# Patient Record
Sex: Male | Born: 1985 | Hispanic: Yes | Marital: Single | Smoking: Never smoker
Health system: Southern US, Community
[De-identification: ages and names within clinical notes are randomized; demographics above are authoritative.]

## PROBLEM LIST (undated history)

## (undated) DIAGNOSIS — K469 Unspecified abdominal hernia without obstruction or gangrene: Secondary | ICD-10-CM

## (undated) DIAGNOSIS — K760 Fatty (change of) liver, not elsewhere classified: Secondary | ICD-10-CM

---

## 2016-12-21 ENCOUNTER — Other Ambulatory Visit: Payer: Self-pay

## 2016-12-21 ENCOUNTER — Emergency Department
Admission: EM | Admit: 2016-12-21 | Discharge: 2016-12-21 | Disposition: A | Discharge status: Home / Self Care | Payer: Self-pay | Attending: Student in an Organized Health Care Education/Training Program | Admitting: Student in an Organized Health Care Education/Training Program

## 2016-12-21 ENCOUNTER — Emergency Department: Payer: Self-pay

## 2016-12-21 ENCOUNTER — Encounter: Payer: Self-pay | Admitting: Emergency Medicine

## 2016-12-21 DIAGNOSIS — K824 Cholesterolosis of gallbladder: Secondary | ICD-10-CM | POA: Insufficient documentation

## 2016-12-21 DIAGNOSIS — R1011 Right upper quadrant pain: Secondary | ICD-10-CM

## 2016-12-21 HISTORY — DX: Fatty (change of) liver, not elsewhere classified: K76.0

## 2016-12-21 HISTORY — DX: Unspecified abdominal hernia without obstruction or gangrene: K46.9

## 2016-12-21 LAB — COMPREHENSIVE METABOLIC PANEL
ALBUMIN: 4.4 g/dL (ref 3.5–5.0)
ALK PHOS: 88 U/L (ref 38–126)
ALT: 30 U/L (ref 17–63)
AST: 25 U/L (ref 15–41)
Anion gap: 7 (ref 5–15)
BUN: 11 mg/dL (ref 6–20)
CALCIUM: 9.5 mg/dL (ref 8.9–10.3)
CO2: 27 mmol/L (ref 22–32)
CREATININE: 0.72 mg/dL (ref 0.61–1.24)
Chloride: 102 mmol/L (ref 101–111)
GFR calc non Af Amer: >60 mL/min (ref >60)
GLUCOSE: 106 mg/dL — AB (ref 65–99)
Potassium: 4.3 mmol/L (ref 3.5–5.1)
SODIUM: 136 mmol/L (ref 135–145)
Total Bilirubin: 0.6 mg/dL (ref 0.3–1.2)
Total Protein: 8 g/dL (ref 6.5–8.1)

## 2016-12-21 LAB — CBC
HCT: 47.1 % (ref 40.0–52.0)
Hemoglobin: 15.9 g/dL (ref 13.0–18.0)
MCH: 29.5 pg (ref 26.0–34.0)
MCHC: 33.6 g/dL (ref 32.0–36.0)
MCV: 87.8 fL (ref 80.0–100.0)
PLATELETS: 237 10*3/uL (ref 150–440)
RBC: 5.37 MIL/uL (ref 4.40–5.90)
RDW: 13.4 % (ref 11.5–14.5)
WBC: 13.5 10*3/uL — ABNORMAL HIGH (ref 3.8–10.6)

## 2016-12-21 LAB — LIPASE, BLOOD: Lipase: 26 U/L (ref 11–51)

## 2016-12-21 MED ORDER — MORPHINE SULFATE (PF) 4 MG/ML IV SOLN: 4.0000 mg | INTRAVENOUS | Status: DC | PRN

## 2016-12-21 MED ORDER — RANITIDINE HCL 150 MG PO TABS: 150.0000 mg | ORAL_TABLET | Freq: Two times a day (BID) | ORAL | 0 refills | Status: AC

## 2016-12-21 MED ORDER — DICYCLOMINE HCL 10 MG PO CAPS: 10.0000 mg | ORAL_CAPSULE | Freq: Three times a day (TID) | ORAL | 0 refills | Status: DC | PRN

## 2016-12-21 MED ORDER — DICYCLOMINE HCL 10 MG PO CAPS: 10.0000 mg | ORAL_CAPSULE | Freq: Once | ORAL | Status: DC

## 2016-12-21 NOTE — ED Notes (Signed)
Pt does not speak [place] - see assessment and notes from provider

## 2016-12-21 NOTE — ED Provider Notes (Signed)
[facility] Emergency Department Provider Note    First MD Initiated Contact with Patient 12/21/16 2020     (approximate)  I have reviewed the triage vital signs and the nursing notes.   HISTORY  Chief Complaint Abdominal Pain    HPI Vincent Kelly is a 31 y.o. male presents with history of chronic right upper quadrant pain that became acutely worse last week.  States he is got a history of fatty liver disease diagnosed with ultrasound and [place].  States he has a long history of drinking but has not had any alcohol recently.  Denies feeling nauseated and has had some episodes of vomiting particularly after eating.  This is particularly worse with eating fatty foods and spicy foods.  Denies any chest pain or shortness of breath.  No dysuria or hematuria.  Past Medical History:  Diagnosis Date  . Fatty liver disease, nonalcoholic   . Hernia of abdominal cavity    No family history on file. No past surgical history on file. There are no active problems to display for this patient.     Prior to Admission medications   Not on File    Allergies Naproxen    Social History Social History   Tobacco Use  . Smoking status: Never Smoker  . Smokeless tobacco: Never Used  Substance Use Topics  . Alcohol use: Yes  . Drug use: No    Review of Systems Patient denies headaches, rhinorrhea, blurry vision, numbness, shortness of breath, chest pain, edema, cough, abdominal pain, nausea, vomiting, diarrhea, dysuria, fevers, rashes or hallucinations unless otherwise stated above in HPI. ____________________________________________   PHYSICAL EXAM:  VITAL SIGNS: Vitals:   12/21/16 1847  BP: 127/83  Pulse: 82  Resp: 16  Temp: 98.3 F (36.8 C)  SpO2: 100%    Constitutional: Alert and oriented. Well appearing and in no acute distress. Eyes: Conjunctivae are normal.  Head: Atraumatic. Nose: No congestion/rhinnorhea. Mouth/Throat: Mucous  membranes are moist.   Neck: No stridor. Painless ROM.  Cardiovascular: Normal rate, regular rhythm. Grossly normal heart sounds.  Good peripheral circulation. Respiratory: Normal respiratory effort.  No retractions. Lungs CTAB. Gastrointestinal: Soft with mild ruq ttp. No distention. No abdominal bruits. No CVA tenderness. Genitourinary:  Musculoskeletal: No lower extremity tenderness nor edema.  No joint effusions. Neurologic:  Normal speech and language. No gross focal neurologic deficits are appreciated. No facial droop Skin:  Skin is warm, dry and intact. No rash noted. Psychiatric: Mood and affect are normal. Speech and behavior are normal.  ____________________________________________   LABS (all labs ordered are listed, but only abnormal results are displayed)  Results for orders placed or performed during the hospital encounter of 12/21/16 (from the past 24 hour(s))  Lipase, blood     Status: None   Collection Time: 12/21/16  6:53 PM  Result Value Ref Range   Lipase 26 11 - 51 U/L  Comprehensive metabolic panel     Status: Abnormal   Collection Time: 12/21/16  6:53 PM  Result Value Ref Range   Sodium 136 135 - 145 mmol/L   Potassium 4.3 3.5 - 5.1 mmol/L   Chloride 102 101 - 111 mmol/L   CO2 27 22 - 32 mmol/L   Glucose, Bld 106 (H) 65 - 99 mg/dL   BUN 11 6 - 20 mg/dL   Creatinine, Ser 1.610.72 0.61 - 1.24 mg/dL   Calcium 9.5 8.9 - 09.610.3 mg/dL   Total Protein 8.0 6.5 - 8.1 g/dL  Albumin 4.4 3.5 - 5.0 g/dL   AST 25 15 - 41 U/L   ALT 30 17 - 63 U/L   Alkaline Phosphatase 88 38 - 126 U/L   Total Bilirubin 0.6 0.3 - 1.2 mg/dL   GFR calc non Af Amer >60 >60 mL/min   GFR calc Af Amer >60 >60 mL/min   Anion gap 7 5 - 15  CBC     Status: Abnormal   Collection Time: 12/21/16  8:07 PM  Result Value Ref Range   WBC 13.5 (H) 3.8 - 10.6 K/uL   RBC 5.37 4.40 - 5.90 MIL/uL   Hemoglobin 15.9 13.0 - 18.0 g/dL   HCT 40.947.1 81.140.0 - 91.452.0 %   MCV 87.8 80.0 - 100.0 fL   MCH 29.5 26.0 -  34.0 pg   MCHC 33.6 32.0 - 36.0 g/dL   RDW 78.213.4 95.611.5 - 21.314.5 %   Platelets 237 150 - 440 K/uL   ____________________________________________ _________________________________  RADIOLOGY  I personally reviewed all radiographic images ordered to evaluate for the above acute complaints and reviewed radiology reports and findings.  These findings were personally discussed with the patient.  Please see medical record for radiology report.  ____________________________________________   PROCEDURES  Procedure(s) performed:  Procedures    Critical Care performed: no ____________________________________________   INITIAL IMPRESSION / ASSESSMENT AND PLAN / ED COURSE  Pertinent labs & imaging results that were available during my care of the patient were reviewed by me and considered in my medical decision making (see chart for details).  DDX: hepatitis, cholecystitis, cholelithiasis, pancreatitis, msk strain  Vincent Kelly is a [age] y.o. who presents to the ED with symptoms as described above.  Patient is well-appearing and in no acute distress.  He is afebrile and hemodynamically stable.  Blood work and ultrasound ordered for the above differential shows no evidence of acute cholelithiasis or Coley cystitis.  No evidence of pancreatitis.  Patient does have mild leukocytosis may be secondary to nausea.  No diarrhea no chest pain.  No signs or symptoms of pneumonia or acute appendicitis.  Denies any dysuria to suggest UTI and patient was unable to provide urinalysis.  Patient will be given referral for further workup as an outpatient given the duration of the symptoms and prescription for pain medication.  Have discussed with the patient and available family all diagnostics and treatments performed thus far and all questions were answered to the best of my ability. The patient demonstrates understanding and agreement with plan.        ____________________________________________   FINAL CLINICAL IMPRESSION(S) / ED DIAGNOSES  Final diagnoses:  RUQ pain  Cholesterolosis of gallbladder      NEW MEDICATIONS STARTED DURING THIS VISIT:  This SmartLink is deprecated. Use AVSMEDLIST instead to display the medication list for a patient.   Note:  This document was prepared using Dragon voice recognition software and may include unintentional dictation errors.    Willy Eddyobinson, Jaala Bohle, MD 12/21/16 [phone]

## 2016-12-21 NOTE — ED Triage Notes (Signed)
Pt in via POV with complaints of RUQ abdominal pain x approximately one week.  Pt reports hx of fatty liver disease, reports being off medications x 2 months.  Vitals WDL, NAD noted at this time.

## 2017-03-24 ENCOUNTER — Emergency Department
Admission: EM | Admit: 2017-03-24 | Discharge: 2017-03-24 | Disposition: A | Discharge status: Home / Self Care | Payer: Self-pay | Attending: Emergency Medicine | Admitting: Emergency Medicine

## 2017-03-24 ENCOUNTER — Emergency Department: Payer: Self-pay

## 2017-03-24 ENCOUNTER — Encounter: Payer: Self-pay | Admitting: Emergency Medicine

## 2017-03-24 ENCOUNTER — Other Ambulatory Visit: Payer: Self-pay

## 2017-03-24 DIAGNOSIS — R1013 Epigastric pain: Secondary | ICD-10-CM | POA: Insufficient documentation

## 2017-03-24 LAB — COMPREHENSIVE METABOLIC PANEL
ALK PHOS: 74 U/L (ref 38–126)
ALT: 39 U/L (ref 17–63)
AST: 34 U/L (ref 15–41)
Albumin: 4.5 g/dL (ref 3.5–5.0)
Anion gap: 9 (ref 5–15)
BUN: 12 mg/dL (ref 6–20)
CALCIUM: 9.2 mg/dL (ref 8.9–10.3)
CHLORIDE: 107 mmol/L (ref 101–111)
CO2: 23 mmol/L (ref 22–32)
CREATININE: 0.74 mg/dL (ref 0.61–1.24)
Glucose, Bld: 105 mg/dL — ABNORMAL HIGH (ref 65–99)
Potassium: 3.8 mmol/L (ref 3.5–5.1)
SODIUM: 139 mmol/L (ref 135–145)
Total Bilirubin: 0.5 mg/dL (ref 0.3–1.2)
Total Protein: 8.2 g/dL — ABNORMAL HIGH (ref 6.5–8.1)

## 2017-03-24 LAB — CBC
HCT: 45.4 % (ref 40.0–52.0)
Hemoglobin: 15.5 g/dL (ref 13.0–18.0)
MCH: 30.4 pg (ref 26.0–34.0)
MCHC: 34.1 g/dL (ref 32.0–36.0)
MCV: 89 fL (ref 80.0–100.0)
PLATELETS: 248 10*3/uL (ref 150–440)
RBC: 5.1 MIL/uL (ref 4.40–5.90)
RDW: 14 % (ref 11.5–14.5)
WBC: 10 10*3/uL (ref 3.8–10.6)

## 2017-03-24 LAB — URINALYSIS, COMPLETE (UACMP) WITH MICROSCOPIC
Bacteria, UA: NONE SEEN
Bilirubin Urine: NEGATIVE
GLUCOSE, UA: NEGATIVE mg/dL
Hgb urine dipstick: NEGATIVE
KETONES UR: NEGATIVE mg/dL
Leukocytes, UA: NEGATIVE
Nitrite: NEGATIVE
PH: 5 (ref 5.0–8.0)
Protein, ur: NEGATIVE mg/dL
SPECIFIC GRAVITY, URINE: 1.026 (ref 1.005–1.030)
SQUAMOUS EPITHELIAL / LPF: NONE SEEN

## 2017-03-24 LAB — TROPONIN I: Troponin I: <0.03 ng/mL (ref <0.03)

## 2017-03-24 LAB — LIPASE, BLOOD: LIPASE: 29 U/L (ref 11–51)

## 2017-03-24 MED ORDER — DICYCLOMINE HCL 10 MG PO CAPS: 10.0000 mg | ORAL_CAPSULE | Freq: Four times a day (QID) | ORAL | 0 refills | Status: AC

## 2017-03-24 MED ORDER — OXYCODONE-ACETAMINOPHEN 5-325 MG PO TABS
1.0000 | ORAL_TABLET | Freq: Once | ORAL | Status: AC
  Administered 2017-03-24: 1 via ORAL
  Filled 2017-03-24: qty 1

## 2017-03-24 MED ORDER — GI COCKTAIL ~~LOC~~
30.0000 mL | Freq: Once | ORAL | Status: AC
  Administered 2017-03-24: 30 mL via ORAL
  Filled 2017-03-24: qty 30

## 2017-03-24 MED ORDER — FAMOTIDINE 20 MG PO TABS: 20.0000 mg | ORAL_TABLET | Freq: Two times a day (BID) | ORAL | 1 refills | Status: AC

## 2017-03-24 NOTE — ED Notes (Signed)
esig pad not working

## 2017-03-24 NOTE — ED Notes (Addendum)
Pt states seen here before for same, dx with fatty gallbladder. tx with unknown medication, remained pain free for 1 month, but pain has returned. Denies N/V but reports diarrhea when pain is flaring. Pt did not follow up with GI.

## 2017-03-24 NOTE — ED Notes (Signed)

## 2017-03-24 NOTE — ED Triage Notes (Signed)
Pt arrived with friend with complaints of upper abdominal/lower right chest pain. Pt states he was seen here 3 months ago with a similar pain that was attributed to his gallbladder. Pt states he was sent home with medication but ran out a month ago. Pt is unsure of what the medication was.

## 2017-03-24 NOTE — ED Provider Notes (Signed)
[facility] Emergency Department Provider Note  ____________________________________________   I have reviewed the triage vital signs and the nursing notes. Where available I have reviewed prior notes and, if possible and indicated, outside hospital notes.    HISTORY  Chief Complaint Abdominal Pain    HPI Vincent Kelly is a 32 y.o. male who has a history of nonalcoholic fatty liver disease, who actually used to drink alcohol every day until 15 days ago, states that he has had flashes of right-sided and epigastric abdominal discomfort off and on for the last several months.  He was seen for this in November of last year.  Reassuring ultrasound that showed only fatty liver, and went home.  The pain has come and gone.  Nothing makes it better nothing makes it worse except for sometimes spicy food does.  Nonradiating.  He denies any fever or chills.  No vomiting, sometimes he does have loose stools.  He does try to avoid certain foods that make the pain worse.  He has been advised that he has had acid indigestion issues in the past, he was trying to follow-up as an outpatient with a GI physician but was unable to find anyone to see him.  He states he has not been drinking alcohol for the last 15 days because he thought it was making his abdominal pain worse.  He denies any melena or bright red blood per rectum hematemesis, he denies dysuria urinary frequency or heme hematuria, he denies any other complaints.  He has no chest pain no shortness of breath.  The pain is very focal to the epigastric right upper quadrant and it is only worse with certain foods.   Past Medical History:  Diagnosis Date  . Fatty liver disease, nonalcoholic   . Hernia of abdominal cavity     There are no active problems to display for this patient.   History reviewed. No pertinent surgical history.  Prior to Admission medications   Medication Sig Start Date End Date Taking? Authorizing  Provider  dicyclomine (BENTYL) 10 MG capsule Take 1 capsule (10 mg total) 3 (three) times daily as needed for up to 14 days by mouth for spasms. 12/21/16 01/04/17  Willy Eddy, MD  ranitidine (ZANTAC) 150 MG tablet Take 1 tablet (150 mg total) 2 (two) times daily by mouth. 12/21/16 12/21/17  Willy Eddy, MD    Allergies Naproxen  No family history on file.  Social History Social History   Tobacco Use  . Smoking status: Never Smoker  . Smokeless tobacco: Never Used  Substance Use Topics  . Alcohol use: Yes  . Drug use: No    Review of Systems Constitutional: No fever/chills Eyes: No visual changes. ENT: No sore throat. No stiff neck no neck pain Cardiovascular: Denies chest pain. Respiratory: Denies shortness of breath. Gastrointestinal:   no vomiting.  Positive slight diarrhea.  No constipation. Genitourinary: Negative for dysuria. Musculoskeletal: Negative lower extremity swelling Skin: Negative for rash. Neurological: Negative for severe headaches, focal weakness or numbness.   ____________________________________________   PHYSICAL EXAM:  VITAL SIGNS: ED Triage Vitals  Enc Vitals Group     BP 03/24/17 1724 106/67     Pulse Rate 03/24/17 1724 80     Resp 03/24/17 1724 20     Temp 03/24/17 1724 100 F (37.8 C)     Temp Source 03/24/17 1724 Oral     SpO2 03/24/17 1724 96 %     Weight 03/24/17 1722 242 lb (109.8  kg)     Height 03/24/17 1722 5' 10.47" (1.79 m)     Head Circumference --      Peak Flow --      Pain Score 03/24/17 1722 10     Pain Loc --      Pain Edu? --      Excl. in GC? --     Constitutional: Alert and oriented. Well appearing and in no acute distress. Eyes: Conjunctivae are normal Head: Atraumatic HEENT: No congestion/rhinnorhea. Mucous membranes are moist.  Oropharynx non-erythematous Neck:   Nontender with no meningismus, no masses, no stridor Cardiovascular: Normal rate, regular rhythm. Grossly normal heart sounds.  Good  peripheral circulation. Respiratory: Normal respiratory effort.  No retractions. Lungs CTAB. Abdominal: Soft and there is minimal epigastric and right upper quadrant abdominal discomfort. No distention. No guarding no rebound Back:  There is no focal tenderness or step off.  there is no midline tenderness there are no lesions noted. there is no CVA tenderness Musculoskeletal: No lower extremity tenderness, no upper extremity tenderness. No joint effusions, no DVT signs strong distal pulses no edema Neurologic:  Normal speech and language. No gross focal neurologic deficits are appreciated.  Skin:  Skin is warm, dry and intact. No rash noted. Psychiatric: Mood and affect are normal. Speech and behavior are normal.  ____________________________________________   LABS (all labs ordered are listed, but only abnormal results are displayed)  Labs Reviewed  COMPREHENSIVE METABOLIC PANEL - Abnormal; Notable for the following components:      Result Value   Glucose, Bld 105 (*)    Total Protein 8.2 (*)    All other components within normal limits  URINALYSIS, COMPLETE (UACMP) WITH MICROSCOPIC - Abnormal; Notable for the following components:   Color, Urine YELLOW (*)    APPearance CLEAR (*)    All other components within normal limits  LIPASE, BLOOD  CBC    Pertinent labs  results that were available during my care of the patient were reviewed by me and considered in my medical decision making (see chart for details). ____________________________________________  EKG  I personally interpreted any EKGs ordered by me or triage EKG ordered in triage is normal sinus rhythm rate 84 bpm, flipped T waves noted inferiorly, normal axis, no acute ST elevation, nonspecific ST changes ____________________________________________  RADIOLOGY  Pertinent labs & imaging results that were available during my care of the patient were reviewed by me and considered in my medical decision making (see chart  for details). If possible, patient and/or family made aware of any abnormal findings.  Ct Renal Stone Study  Result Date: 03/24/2017 CLINICAL DATA:  Flank pain, upper abdominal and lower RIGHT chest pain EXAM: CT ABDOMEN AND PELVIS WITHOUT CONTRAST TECHNIQUE: Multidetector CT imaging of the abdomen and pelvis was performed following the standard protocol without IV contrast. Sagittal and coronal MPR images reconstructed from axial data set. COMPARISON:  None FINDINGS: Lower chest: Lung bases clear Hepatobiliary: Gallbladder and liver normal appearance Pancreas: Normal appearance Spleen: Normal appearance Adrenals/Urinary Tract: Adrenal glands normal appearance. Kidneys, ureters, and bladder normal appearance. No urinary tract calcification or dilatation. Stomach/Bowel: Normal appendix, retrocecal. Stomach and bowel loops normal appearance. Vascular/Lymphatic: Unremarkable Reproductive: Normal appearing prostate gland and seminal vesicles Other: No free air or free fluid. Tiny umbilical hernia containing fat. No acute inflammatory process. Musculoskeletal: Normal appearance IMPRESSION: Tiny umbilical hernia containing fat. Otherwise normal exam. Electronically Signed   By: Ulyses SouthwardMark  Boles M.D.   On: 03/24/2017 20:01   ____________________________________________  PROCEDURES  Procedure(s) performed: None  Procedures  Critical Care performed: None  ____________________________________________   INITIAL IMPRESSION / ASSESSMENT AND PLAN / ED COURSE  Pertinent labs & imaging results that were available during my care of the patient were reviewed by me and considered in my medical decision making (see chart for details).  Here with very reproducible right upper quadrant and epigastric abdominal discomfort.  Low suspicion for PE ACS or dissection.  This is very reproducible food related pain.  He is morbidly obese.  I did do a CT scan as a precaution, CT scan does not show any kidney disease, or kidney  stones, he does have pain towards the right side.  Though he does not have flank pain per se.  Liver function tests are reassuring blood work is reassuring vital signs are reassuring patient has been having pain for 3 months exactly like this on a daily basis low suspicion for ACS. Patient speaks Spanish, he is very comfortable with my Spanish he does not want me to call for an interpreter at this time, he states that he changes his mind she will let me know.  he feels that there are no barriers to communication between [place] at this time Given somewhat abnormal EKG even though he is only 35 and has no evidence of ACS by history we will send cardiac marker.  If that is negative I think we can send him home safely.  He is been having this pain every day for months, all day today.  We will give him pain medication here I will advise close outpatient follow-up with gastroenterology, will also give him antiacids.  Very benign exam and all of her findings are reassuring thus far   ____________________________________________   FINAL CLINICAL IMPRESSION(S) / ED DIAGNOSES  Final diagnoses:  None      This chart was dictated using voice recognition software.  Despite best efforts to proofread,  errors can occur which can change meaning.      Jeanmarie Plant, MD 03/24/17 2028

## 2017-12-19 ENCOUNTER — Emergency Department: Payer: Self-pay

## 2017-12-19 ENCOUNTER — Emergency Department
Admission: EM | Admit: 2017-12-19 | Discharge: 2017-12-19 | Disposition: A | Discharge status: Home / Self Care | Payer: Self-pay | Attending: Emergency Medicine | Admitting: Emergency Medicine

## 2017-12-19 ENCOUNTER — Other Ambulatory Visit: Payer: Self-pay

## 2017-12-19 DIAGNOSIS — R1011 Right upper quadrant pain: Secondary | ICD-10-CM | POA: Insufficient documentation

## 2017-12-19 DIAGNOSIS — K760 Fatty (change of) liver, not elsewhere classified: Secondary | ICD-10-CM | POA: Insufficient documentation

## 2017-12-19 LAB — COMPREHENSIVE METABOLIC PANEL
ALT: 32 U/L (ref 0–44)
ANION GAP: 7 (ref 5–15)
AST: 24 U/L (ref 15–41)
Albumin: 4.3 g/dL (ref 3.5–5.0)
Alkaline Phosphatase: 86 U/L (ref 38–126)
BILIRUBIN TOTAL: 0.9 mg/dL (ref 0.3–1.2)
BUN: 13 mg/dL (ref 6–20)
CO2: 27 mmol/L (ref 22–32)
Calcium: 9.4 mg/dL (ref 8.9–10.3)
Chloride: 105 mmol/L (ref 98–111)
Creatinine, Ser: 0.83 mg/dL (ref 0.61–1.24)
GFR calc Af Amer: >60 mL/min (ref >60)
Glucose, Bld: 146 mg/dL — ABNORMAL HIGH (ref 70–99)
POTASSIUM: 3.9 mmol/L (ref 3.5–5.1)
Sodium: 139 mmol/L (ref 135–145)
TOTAL PROTEIN: 7.7 g/dL (ref 6.5–8.1)

## 2017-12-19 LAB — URINALYSIS, COMPLETE (UACMP) WITH MICROSCOPIC
BACTERIA UA: NONE SEEN
Bilirubin Urine: NEGATIVE
GLUCOSE, UA: NEGATIVE mg/dL
Hgb urine dipstick: NEGATIVE
KETONES UR: NEGATIVE mg/dL
Leukocytes, UA: NEGATIVE
Nitrite: NEGATIVE
PROTEIN: NEGATIVE mg/dL
SQUAMOUS EPITHELIAL / LPF: NONE SEEN (ref 0–5)
Specific Gravity, Urine: 1.025 (ref 1.005–1.030)
pH: 5 (ref 5.0–8.0)

## 2017-12-19 LAB — CBC
HCT: 46.6 % (ref 39.0–52.0)
HEMOGLOBIN: 15.6 g/dL (ref 13.0–17.0)
MCH: 29.8 pg (ref 26.0–34.0)
MCHC: 33.5 g/dL (ref 30.0–36.0)
MCV: 88.9 fL (ref 80.0–100.0)
Platelets: 273 10*3/uL (ref 150–400)
RBC: 5.24 MIL/uL (ref 4.22–5.81)
RDW: 13.1 % (ref 11.5–15.5)
WBC: 9.2 10*3/uL (ref 4.0–10.5)
nRBC: 0 % (ref 0.0–0.2)

## 2017-12-19 LAB — LIPASE, BLOOD: Lipase: 33 U/L (ref 11–51)

## 2017-12-19 MED ORDER — FAMOTIDINE 20 MG PO TABS: 20.0000 mg | ORAL_TABLET | Freq: Two times a day (BID) | ORAL | 1 refills | Status: AC

## 2017-12-19 MED ORDER — MORPHINE SULFATE (PF) 4 MG/ML IV SOLN
4.0000 mg | Freq: Once | INTRAVENOUS | Status: AC
  Administered 2017-12-19: 4 mg via INTRAVENOUS
  Filled 2017-12-19: qty 1

## 2017-12-19 MED ORDER — IOHEXOL 300 MG/ML  SOLN
100.0000 mL | Freq: Once | INTRAMUSCULAR | Status: AC | PRN
  Administered 2017-12-19: 100 mL via INTRAVENOUS
  Filled 2017-12-19: qty 100

## 2017-12-19 MED ORDER — OXYCODONE HCL 5 MG PO TABS: 5.0000 mg | ORAL_TABLET | Freq: Three times a day (TID) | ORAL | 0 refills | Status: AC | PRN

## 2017-12-19 NOTE — ED Provider Notes (Signed)
[facility] Emergency Department Provider Note       Time seen: ----------------------------------------- 5:21 PM on 12/19/2017 -----------------------------------------   I have reviewed the triage vital signs and the nursing notes.  HISTORY   Chief Complaint Abdominal Pain    HPI Vincent Kelly is a 32 y.o. male with a history of fatty liver disease who presents to the ED for sharp and stabbing right upper quadrant pain that started 4 to 5 days ago after period of heavy drinking.  He denies any vomiting.  He has been seen for this in the past but had a negative ultrasound.  He denies fevers, chills or other complaints.  Past Medical History:  Diagnosis Date  . Fatty liver disease, nonalcoholic   . Hernia of abdominal cavity     There are no active problems to display for this patient.   History reviewed. No pertinent surgical history.  Allergies Naproxen  Social History Social History   Tobacco Use  . Smoking status: Never Smoker  . Smokeless tobacco: Never Used  Substance Use Topics  . Alcohol use: Yes  . Drug use: No   Review of Systems Constitutional: Negative for fever. Cardiovascular: Negative for chest pain. Respiratory: Negative for shortness of breath. Gastrointestinal: Positive for abdominal pain Musculoskeletal: Negative for back pain. Skin: Negative for rash. Neurological: Negative for headaches, focal weakness or numbness.  All systems negative/normal/unremarkable except as stated in the HPI  ____________________________________________   PHYSICAL EXAM:  VITAL SIGNS: ED Triage Vitals [12/19/17 1459]  Enc Vitals Group     BP (!) 142/84     Pulse Rate 97     Resp 18     Temp 98 F (36.7 C)     Temp Source Oral     SpO2 98 %     Weight 245 lb (111.1 kg)     Height 5' 10.47" (1.79 m)     Head Circumference      Peak Flow      Pain Score 10     Pain Loc      Pain Edu?      Excl. in GC?     Constitutional: Alert and oriented.  Mild distress Eyes: Conjunctivae are normal. Normal extraocular movements. Cardiovascular: Normal rate, regular rhythm. No murmurs, rubs, or gallops. Respiratory: Normal respiratory effort without tachypnea nor retractions. Breath sounds are clear and equal bilaterally. No wheezes/rales/rhonchi. Gastrointestinal: Right upper quadrant tenderness, no rebound or guarding.  Normal bowel sounds. Musculoskeletal: Nontender with normal range of motion in extremities. No lower extremity tenderness nor edema. Neurologic:  Normal speech and language. No gross focal neurologic deficits are appreciated.  Skin:  Skin is warm, dry and intact. No rash noted. Psychiatric: Mood and affect are normal. Speech and behavior are normal.  ____________________________________________  ED COURSE:  As part of my medical decision making, I reviewed the following data within the electronic MEDICAL RECORD NUMBER History obtained from family if available, nursing notes, old chart and ekg, as well as notes from prior ED visits. Patient presented for abdominal pain, we will assess with labs and imaging as indicated at this time.   Procedures ____________________________________________   LABS (pertinent positives/negatives)  Labs Reviewed  COMPREHENSIVE METABOLIC PANEL - Abnormal; Notable for the following components:      Result Value   Glucose, Bld 146 (*)    All other components within normal limits  URINALYSIS, COMPLETE (UACMP) WITH MICROSCOPIC - Abnormal; Notable for the following components:   Color, Urine  YELLOW (*)    APPearance CLEAR (*)    All other components within normal limits  LIPASE, BLOOD  CBC    RADIOLOGY  CT the abdomen pelvis with contrast IMPRESSION: 1. No acute abnormality in the abdomen or pelvis. 2. Small calcification near the origin of the appendix is suggestive for an appendicolith but no evidence for appendix inflammation or distension. 3.  Tiny umbilical hernia without complicating features. ____________________________________________  DIFFERENTIAL DIAGNOSIS   Gastritis, hepatitis, pancreatitis, dehydration, biliary colic, cholecystitis  FINAL ASSESSMENT AND PLAN  Abdominal pain   Plan: The patient had presented for right upper quadrant pain. Patient's labs were unremarkable. Patient's imaging also unremarkable.  No clear etiology for his abdominal pain is been discovered.  I will prescribe an antacid for him as well as pain medicine and refer him to gastroenterology for follow-up.   Ulice Dash, MD   Note: This note was generated in part or whole with voice recognition software. Voice recognition is usually quite accurate but there are transcription errors that can and very often do occur. I apologize for any typographical errors that were not detected and corrected.     Emily Filbert, MD 12/19/17 [phone]

## 2017-12-19 NOTE — ED Notes (Addendum)
Pt needs interpreter per his request Interpreter request placed

## 2017-12-19 NOTE — ED Triage Notes (Signed)
RUQ pain X 5 days ago. Pt unable to sit due to pain. Nausea and emesis. Denies CP. Endorses SOB.   Interpreter used for triage.

## 2017-12-19 NOTE — ED Notes (Signed)
Pt c/o sharp stabbing pain in right upper quad that started 4-5 days ago after a period of heavy drinking - denies vomiting - reports nausea

## 2017-12-19 NOTE — ED Notes (Signed)
Patient transported to CT 

## 2018-01-10 ENCOUNTER — Ambulatory Visit: Payer: Self-pay | Admitting: Gastroenterology

## 2018-03-01 ENCOUNTER — Ambulatory Visit: Payer: Self-pay | Admitting: Gastroenterology

## 2018-03-01 ENCOUNTER — Encounter: Payer: Self-pay | Admitting: Gastroenterology

## 2018-03-01 VITALS — BP 110/73 | HR 77 | Ht 70.0 in | Wt 272.2 lb

## 2018-03-01 DIAGNOSIS — K581 Irritable bowel syndrome with constipation: Secondary | ICD-10-CM

## 2018-03-01 DIAGNOSIS — R1011 Right upper quadrant pain: Secondary | ICD-10-CM

## 2018-03-01 DIAGNOSIS — R1013 Epigastric pain: Secondary | ICD-10-CM

## 2018-03-01 MED ORDER — OMEPRAZOLE 40 MG PO CPDR: 40.0000 mg | DELAYED_RELEASE_CAPSULE | Freq: Every day | ORAL | 3 refills | Status: AC

## 2018-03-01 NOTE — Progress Notes (Signed)
Wyline Mood MD, MRCP(U.K) [address]  [address]  [place], [place] [address]  Main: [phone]  Fax: [phone]   Gastroenterology Consultation  Referring Provider:     ER Primary Care Physician:  Patient, No Pcp Per Primary Gastroenterologist:  Dr. Wyline Mood  Reason for Consultation:     Abdominal pain         HPI:   Vincent Kelly is a 33 y.o. y/o male referred for abdominal pain . Does not speak any English , here with live interpretor who conducted the interview    He has been referred from the ER. He presented to the ER on 12/19/17 with RUQ pain of 5 days duration after drinking a lot of alcohol.He underwent a CT scan of the abdomen  Was normal except for a tiny umbilical hernia.   He also presented to the ER on 08/05/2017 with RUQ pain . Treated conservatively and discharged. RUQ USG in 2018 showed no gall stones.   LFT's and Lipase checked on 3 occasions have been normal.    He says that he has had abdominal pain since 4 years and he was in [place] who did an EGD , took several medicines which helped at that time. He was told he had a Hernia on his EGD.   The pain occurs at 5-6 am almost every with severe pain , the pain lasts till he takes pepcid. He describes the pain as a squeeze ,epigatric , during the day goes to the RUQ and can even affect his breathing due to intensity.  He states Lansoprazole given in [place] helped. Presently run out of them . He has a bowel movement once a day , used to suffer with constipation. Presently states his stool is normal .   Pain is better after a bowel movement at times but not always.   Drinks alcohol occasionally once a month .   Past Medical History:  Diagnosis Date  . Fatty liver disease, nonalcoholic   . Hernia of abdominal cavity     No past surgical history on file.  Prior to Admission medications   Medication Sig Start Date End Date Taking? Authorizing Provider  dicyclomine (BENTYL) 10 MG capsule Take 1  capsule (10 mg total) by mouth 4 (four) times daily for 14 days. 03/24/17 04/07/17  Jeanmarie Plant, MD  famotidine (PEPCID) 20 MG tablet Take 1 tablet (20 mg total) by mouth 2 (two) times daily. 03/24/17 03/24/18  Jeanmarie Plant, MD  famotidine (PEPCID) 20 MG tablet Take 1 tablet (20 mg total) by mouth 2 (two) times daily. 12/19/17   Emily Filbert, MD  oxyCODONE (ROXICODONE) 5 MG immediate release tablet Take 1 tablet (5 mg total) by mouth every 8 (eight) hours as needed. 12/19/17 12/19/18  Emily Filbert, MD  ranitidine (ZANTAC) 150 MG tablet Take 1 tablet (150 mg total) 2 (two) times daily by mouth. 12/21/16 12/21/17  Willy Eddy, MD    No family history on file.   Social History   Tobacco Use  . Smoking status: Never Smoker  . Smokeless tobacco: Never Used  Substance Use Topics  . Alcohol use: Yes  . Drug use: No    Allergies as of 03/01/2018 - Review Complete 12/19/2017  Allergen Reaction Noted  . Naproxen Other (See Comments) 12/21/2016    Review of Systems:    All systems reviewed and negative except where noted in HPI.   Physical Exam:  There were no vitals taken for  this visit. No LMP for male patient. Psych:  Alert and cooperative. Normal mood and affect. General:   Alert,  Well-developed, well-nourished, pleasant and cooperative in NAD Head:  Normocephalic and atraumatic. Eyes:  Sclera clear, no icterus.   Conjunctiva pink. Ears:  Normal auditory acuity. Nose:  No deformity, discharge, or lesions. Mouth:  No deformity or lesions,oropharynx pink & moist. Neck:  Supple; no masses or thyromegaly. Lungs:  Respirations even and unlabored.  Clear throughout to auscultation.   No wheezes, crackles, or rhonchi. No acute distress. Heart:  Regular rate and rhythm; no murmurs, clicks, rubs, or gallops. Abdomen:  Normal bowel sounds.  No bruits.  Soft, non-tender and non-distended without masses, hepatosplenomegaly or hernias noted.  No guarding or rebound tenderness.     Msk:  Symmetrical without gross deformities. Good, equal movement & strength bilaterally. Pulses:  Normal pulses noted. Extremities:  No clubbing or edema.  No cyanosis. Neurologic:  Alert and oriented x3;  grossly normal neurologically. Skin:  Intact without significant lesions or rashes. No jaundice. Lymph Nodes:  No significant cervical adenopathy. Psych:  Alert and cooperative. Normal mood and affect.  Imaging Studies: No results found.  Assessment and Plan:   Vincent Kelly is a 33 y.o. y/o male has been referred  to see me after being from after his ER visit in 12/2017 for abdominal pain. RUQ , 3 episodes so far. Based on his history Likely a combination of one or more of GERD+/- gastritis+/- IBS-C and will rule out gall bladder dyskinesia.    Plan  1. HIDA scan  2. H pylori breath test  3. Trial of PPI 4. High fiber diet patient information  5. GERD patient information 6. Fiber tablets - samples provided    Entire visit conducted via interpretor, old notes reviewed summarized .   Follow up in 4 weeks  Dr Wyline MoodKiran Shyteria Lewis MD,MRCP(U.K)

## 2018-03-01 NOTE — Patient Instructions (Signed)
Dieta rica en fibra  High-Fiber Diet  La fibra, tambin llamada fibra dietaria, es un tipo de carbohidrato que se encuentra en las frutas, las verduras, los cereales integrales y los frijoles. Una dieta rica en fibra puede tener muchos beneficios para la salud. El mdico puede recomendar una dieta rica en fibra para ayudar a:   Evitar el estreimiento. La fibra puede hacer que defeque con ms frecuencia.   Disminuir el nivel de colesterol.   Aliviar las siguientes afecciones:  ? Hinchazn de la venas en el ano (hemorroides).  ? Hinchazn e irritacin (inflamacin) de zonas especficas del tracto digestivo (diverticulitis sin complicaciones).  ? Un problema del intestino grueso (colon) que, a veces, causa dolor y diarrea (sndrome de colon irritable, SCI).   Evitar comer en exceso como parte de un plan para bajar de peso.   Evitar la enfermedad cardaca, la diabetes tipo 2 y ciertos cnceres.  En qu consiste el plan?  El consumo diario recomendado de fibra en gramos (g) incluye lo siguiente:   38g para hombres de 50 aos o menos.   30g para los hombres mayores de 50 aos.   25g para mujeres de 50 aos o menos.   21g para las mujeres mayores de 50 aos.  Puede recibir la ingesta diaria recomendada de fibra dietaria de la siguiente manera:   Coma una variedad de frutas, verduras, granos y frijoles.   Tome un suplemento de fibra, si no es posible comer suficiente fibra en su dieta.  Qu debo saber acerca de la dieta rica en fibra?   Es mejor obtener fibra directamente de los alimentos en lugar de recibirla de suplementos de fibra. No hay demasiada investigacin acerca de qu tan efectivos son los suplementos.   Verifique siempre el contenido de fibra en la etiqueta de informacin nutricional de los alimentos preenvasados. Busque alimentos que contengan 5g o ms de fibra por porcin.   Hable con un especialista en alimentacin y nutricin (nutricionista) si tiene preguntas sobre alimentos  especficos que se recomiendan o no para su afeccin mdica, especialmente si aquellos alimentos no estn detallados a continuacin.   Aumente gradualmente la cantidad de fibra que consume. Si aumenta demasiado rpido el consumo de fibra dietaria puede provocar distensin abdominal, clicos o gases.   Beba abundante agua. El agua ayuda a digerir la fibra.  Cules son algunos consejos para seguir este plan?   Consuma una gran variedad de alimentos ricos en fibra.   Asegrese de que al menos la mitad de los granos que ingiere cada da sean cereales integrales.   Coma panes y cereales hechos con harina de cereales integrales en lugar de harina refinada o blanca.   Coma arroz integral, trigo burgol o mijo en lugar de arroz blanco.   Comience el da con un desayuno rico en fibra, como un cereal que contenga al menos 5g de fibra o ms por porcin.   Use frijoles en lugar de carne en las sopas, ensaladas o platos de pastas.   Coma colaciones ricas en fibra, como frutos rojos, verduras crudas, frutos secos y palomitas de maz.   Elija frutas y verduras enteras en lugar de formas procesadas como jugo o salsa.  Qu alimentos puedo comer?    Frutas  Frutos rojos. Peras. Manzanas. Naranjas. [place]ac[place]. Ciruelas y pasas. Higos secos.  Verduras  Batatas. Espinaca. Col rizada. Alcachofas. Repollo. Brcoli. Coliflor. Guisantes. Zanahorias. [place]aza.  Cereales  Panes integrales. Cereal multigrano. Avena. Arroz integral. Cebada. Trigo burgol. Mijo. Quinua. Magdalenas   de salvado. Palomitas de maz. Galletas de centeno.  Carnes y otras protenas  Frijoles blancos, colorados y pintos. Soja. Guisantes secos. Lentejas. Frutos secos y semillas.  Lcteos  Yogur fortificado con fibra.  Bebidas  Leche de soja fortificada con fibra. Jugo de naranja fortificado con fibra.  Otros alimentos  Barras de fibra.  Es posible que los productos mencionados arriba no formen una lista completa de las bebidas y los alimentos recomendados.  Comunquese con un nutricionista para conocer ms opciones.  Qu alimentos no se recomiendan?  Frutas  Jugo de frutas. Frutas cocidas coladas.  Verduras  Papas fritas. Verduras enlatadas. Verduras muy cocidas.  Cereales  Pan blanco. Pastas hechas con harina refinada. Arroz blanco.  Carnes y otras protenas  Cortes de carne con grasa. Pollo o pescado fritos.  Lcteos  Leche. Yogur. Queso crema. Crema cida.  Grasas y aceites  Mantequillas.  Bebidas  Gaseosas.  Otros alimentos  Tortas y pasteles.  Es posible que los productos que se enumeran ms arriba no sean una lista completa de los alimentos y las bebidas que se deben evitar. Comunquese con un nutricionista para obtener ms informacin.  Resumen   La fibra es un tipo de carbohidrato. Se encuentra en las frutas, las verduras, los cereales integrales y los frijoles.   Una dieta rica en fibra representa muchos beneficios para la salud, como evitar el estreimiento, bajar el colesterol en la sangre, ayudar a perder peso y reducir el riesgo de enfermedad cardaca, diabetes y ciertos tipos de cncer.   Aumente gradualmente la ingesta de fibra. El aumento demasiado rpido puede provocar clicos, distensin abdominal y gases. Beba mucha agua a la vez que aumenta la fibra.   Las mejores fuentes de fibra incluyen frutas y verduras enteras, granos integrales, frutos secos, semillas y frijoles.  Esta informacin no tiene como fin reemplazar el consejo del mdico. Asegrese de hacerle al mdico cualquier pregunta que tenga.  Document Released: 02/01/2005 Document Revised: 01/21/2017 Document Reviewed: 01/21/2017  Elsevier Interactive Patient Education  2019 [employer]

## 2018-03-02 LAB — H. PYLORI BREATH TEST: H pylori Breath Test: POSITIVE — AB

## 2018-03-06 ENCOUNTER — Encounter: Payer: Self-pay | Admitting: Gastroenterology

## 2018-03-07 ENCOUNTER — Ambulatory Visit: Payer: Self-pay

## 2018-03-10 ENCOUNTER — Other Ambulatory Visit: Payer: Self-pay

## 2018-03-10 ENCOUNTER — Telehealth: Payer: Self-pay

## 2018-03-10 MED ORDER — AMOXICILLIN 500 MG PO CAPS: 1000.0000 mg | ORAL_CAPSULE | Freq: Three times a day (TID) | ORAL | 0 refills | Status: AC

## 2018-03-10 MED ORDER — CLARITHROMYCIN 500 MG PO TABS: 500.0000 mg | ORAL_TABLET | Freq: Two times a day (BID) | ORAL | 0 refills | Status: AC

## 2018-03-10 NOTE — Telephone Encounter (Signed)
-----   Message from Wyline Mood, MD sent at 03/06/2018 11:25 AM EST ----- H pylori positive    Suggest clarithromycin 500 mg PO BID, amoxicillin 1 gram TID, omeprazole 20 mg BID all for 14 days.  , check for penicillin allergy and will need repeat H pylori stool antigen to check for eradication after after 6 weeks off PPI .

## 2018-03-10 NOTE — Telephone Encounter (Signed)
Called pt (using interpreter line) to inform him of lab results and Dr. Johnney Killian directions to commence on medication for treatment.  Unable to contact pt. Interpreter LVM for pt to return call.

## 2018-03-15 ENCOUNTER — Encounter
Admission: RE | Admit: 2018-03-15 | Discharge: 2018-03-15 | Disposition: A | Discharge status: Home / Self Care | Payer: Self-pay | Source: Ambulatory Visit | Attending: Gastroenterology | Admitting: Gastroenterology

## 2018-03-15 DIAGNOSIS — R1011 Right upper quadrant pain: Secondary | ICD-10-CM | POA: Insufficient documentation

## 2018-03-15 MED ORDER — TECHNETIUM TC 99M MEBROFENIN IV KIT
5.1700 | PACK | Freq: Once | INTRAVENOUS | Status: AC | PRN
  Administered 2018-03-15: 5.17 via INTRAVENOUS

## 2018-03-15 NOTE — Telephone Encounter (Signed)
Pt stopped by the office regarding prescriptions for treatment of H pylori. I used the language interpreter line to explain the prescription directions and that the prescriptions were sent to pt preferred pharmacy. Pt understands.

## 2018-03-17 ENCOUNTER — Encounter: Payer: Self-pay | Admitting: Gastroenterology

## 2019-07-17 ENCOUNTER — Ambulatory Visit: Payer: Self-pay

## 2019-08-11 IMAGING — NM NM HEPATO W/GB/PHARM/[PERSON_NAME]
2 series · 12 of 12 positions shown · non-contrast
Comparison: None

CLINICAL DATA: RIGHT upper quadrant abdominal pain for 1 year
primarily after meals, dizziness

EXAM:
NUCLEAR MEDICINE HEPATOBILIARY IMAGING WITH GALLBLADDER EF
TECHNIQUE: Sequential images of the abdomen were obtained [DATE] minutes
following intravenous administration of radiopharmaceutical. After
oral ingestion of Ensure, gallbladder ejection fraction was
determined. At 60 min, normal ejection fraction is greater than 33%.
RADIOPHARMACEUTICALS:  5.17 mCi Pc-55m  Choletec IV

[Series 1000: gallbladder ef · 4.80mm/px · 6 of 120 frames shown]
[frame 11/120]
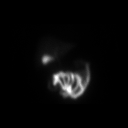
[frame 31/120]
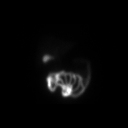
[frame 51/120]
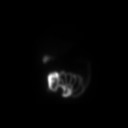
[frame 71/120]
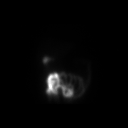
[frame 91/120]
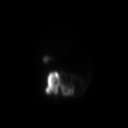
[frame 111/120]
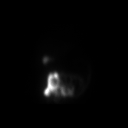

[Series 1000: hepatobiliary scan · 9.59mm/px · 6 of 59 frames shown]
[frame 5/59]
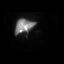
[frame 15/59]
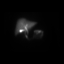
[frame 25/59]
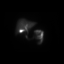
[frame 35/59]
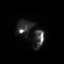
[frame 45/59]
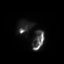
[frame 55/59]
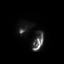

[12 of 12 positions shown; findings below may reference images not displayed]

FINDINGS: Normal tracer extraction from bloodstream indicating normal
hepatocellular function.

Normal excretion of tracer into biliary tree.

Gallbladder visualized at 4 min.

Small bowel visualized at 9 min.

No hepatic retention of tracer.

Subjectively normal emptying of tracer from gallbladder following
fatty meal stimulation.

Calculated gallbladder ejection fraction is 50%, normal.

Patient reported RIGHT upper quadrant pain before Ensure and reflux
following Ensure ingestion.

Normal gallbladder ejection fraction following Ensure ingestion is
greater than 33% at 1 hour.
IMPRESSION: Patent biliary tree.

Normal gallbladder ejection fraction of 50% following fatty meal
stimulation.
